# Patient Record
Sex: Female | Born: 1960 | Race: Black or African American | Hispanic: No | Marital: Married | Smoking: Never smoker
Health system: Southern US, Community
[De-identification: ages and names within clinical notes are randomized; demographics above are authoritative.]

## PROBLEM LIST (undated history)

## (undated) HISTORY — PX: CARPAL TUNNEL RELEASE: SHX101

## (undated) HISTORY — PX: ABDOMINAL HYSTERECTOMY: SHX81

---

## 2004-10-02 ENCOUNTER — Ambulatory Visit (HOSPITAL_COMMUNITY): Admission: RE | Admit: 2004-10-02 | Discharge: 2004-10-02 | Payer: Self-pay | Admitting: Orthopedic Surgery

## 2010-01-23 ENCOUNTER — Encounter: Admission: RE | Admit: 2010-01-23 | Discharge: 2010-01-23 | Payer: Self-pay | Admitting: Unknown Physician Specialty

## 2010-05-13 ENCOUNTER — Encounter: Payer: Self-pay | Admitting: Orthopedic Surgery

## 2012-09-18 ENCOUNTER — Other Ambulatory Visit: Payer: Self-pay

## 2012-09-18 DIAGNOSIS — Z1231 Encounter for screening mammogram for malignant neoplasm of breast: Secondary | ICD-10-CM

## 2012-10-16 ENCOUNTER — Ambulatory Visit: Payer: Self-pay

## 2012-11-04 ENCOUNTER — Ambulatory Visit: Payer: Self-pay

## 2014-02-02 ENCOUNTER — Other Ambulatory Visit: Payer: Self-pay

## 2014-02-02 DIAGNOSIS — Z1231 Encounter for screening mammogram for malignant neoplasm of breast: Secondary | ICD-10-CM

## 2014-02-15 ENCOUNTER — Ambulatory Visit
Admission: RE | Admit: 2014-02-15 | Discharge: 2014-02-15 | Disposition: A | Discharge status: Home / Self Care | Payer: BC Managed Care – PPO | Source: Ambulatory Visit

## 2014-02-15 DIAGNOSIS — Z1231 Encounter for screening mammogram for malignant neoplasm of breast: Secondary | ICD-10-CM

## 2015-02-17 ENCOUNTER — Other Ambulatory Visit: Payer: Self-pay

## 2015-02-17 DIAGNOSIS — Z1231 Encounter for screening mammogram for malignant neoplasm of breast: Secondary | ICD-10-CM

## 2015-03-06 ENCOUNTER — Ambulatory Visit: Payer: Self-pay

## 2017-10-02 ENCOUNTER — Emergency Department (HOSPITAL_BASED_OUTPATIENT_CLINIC_OR_DEPARTMENT_OTHER)
Admission: EM | Admit: 2017-10-02 | Discharge: 2017-10-02 | Disposition: A | Discharge status: Home / Self Care | Payer: Self-pay | Attending: Emergency Medicine | Admitting: Emergency Medicine

## 2017-10-02 ENCOUNTER — Emergency Department (HOSPITAL_BASED_OUTPATIENT_CLINIC_OR_DEPARTMENT_OTHER): Payer: Self-pay

## 2017-10-02 ENCOUNTER — Encounter (HOSPITAL_BASED_OUTPATIENT_CLINIC_OR_DEPARTMENT_OTHER): Payer: Self-pay | Admitting: *Deleted

## 2017-10-02 ENCOUNTER — Other Ambulatory Visit: Payer: Self-pay

## 2017-10-02 DIAGNOSIS — M779 Enthesopathy, unspecified: Secondary | ICD-10-CM

## 2017-10-02 DIAGNOSIS — M778 Other enthesopathies, not elsewhere classified: Secondary | ICD-10-CM

## 2017-10-02 DIAGNOSIS — M70842 Other soft tissue disorders related to use, overuse and pressure, left hand: Secondary | ICD-10-CM | POA: Insufficient documentation

## 2017-10-02 DIAGNOSIS — Y939 Activity, unspecified: Secondary | ICD-10-CM | POA: Insufficient documentation

## 2017-10-02 MED ORDER — IBUPROFEN 600 MG PO TABS: 600.0000 mg | ORAL_TABLET | Freq: Four times a day (QID) | ORAL | 0 refills | Status: DC | PRN

## 2017-10-02 MED ORDER — PREDNISONE 10 MG PO TABS: 20.0000 mg | ORAL_TABLET | Freq: Every day | ORAL | 0 refills | Status: AC

## 2017-10-02 NOTE — ED Triage Notes (Signed)
Left wrist pain x 3.5 weeks. No injury. Hx of carpal tunnel 20 years ago.

## 2017-10-02 NOTE — Discharge Instructions (Signed)
Thank you for allowing me to provide your care today in the emergency department.  Your x-ray today was negative.  To treat your symptoms at home:  Wear the Ace wrap as needed to provide compression to areas that are painful on your right wrist.  Apply ice for 15 to 20 minutes up to 3-4 times a day.  Make sure to have a cloth or an ice pack so that the ice is not directly on your skin, which can cause thermal burns.  Take 600 mg of ibuprofen with food or 600 mg of Tylenol every 6 hours as needed for pain control.  Prednisone is a corticosteroid medication to help with inflammation.  Take 2 tablets of this medication daily for the next 5 days.  This medication is safe to take with both ibuprofen and Tylenol.  Try to rest the right wrist over the next few days per your pain.  If your symptoms do not improve with this regimen over the next week, call 1 of the clinics above to get set up with primary care.  Return to the emergency department if you develop fever, chills, or if the joint becomes red, hot, or swollen to the touch.

## 2017-10-02 NOTE — ED Provider Notes (Signed)
[facility] EMERGENCY DEPARTMENT Provider Note   CSN: [id number] Arrival date & time: 10/02/17  1702     History   Chief Complaint Chief Complaint  Patient presents with  . Wrist Pain    HPI Theresa Meadows is a 57 y.o. female with no pertinent past medical history who presents to the emergency department with a chief complaint of atraumatic left wrist pain that began 3 weeks ago.  Pain is worse with ulnar deviation, flexion, and extension of the wrist and movement of the thumb.  She denies left elbow or shoulder pain.  She characterizes the pain as sharp.   No recent falls or injuries.  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  Denies any specific repetitive tasks.  She has intermittently treated her symptoms with 400 mg of Advil since onset with intermittent relief.   Wrist Pain  Pertinent negatives include no chest pain, no abdominal pain, no headaches and no shortness of breath.    History reviewed. No pertinent past medical history.  There are no active problems to display for this patient.   Past Surgical History:  Procedure Laterality Date  . ABDOMINAL HYSTERECTOMY    . CARPAL TUNNEL RELEASE    . CESAREAN SECTION       OB History   None      Home Medications    Prior to Admission medications   Medication Sig Start Date End Date Taking? Authorizing Provider  ibuprofen (ADVIL,MOTRIN) 600 MG tablet Take 1 tablet (600 mg total) by mouth every 6 (six) hours as needed. 10/02/17   Lynne Righi A, PA-C  predniSONE (DELTASONE) 10 MG tablet Take 2 tablets (20 mg total) by mouth daily for 5 days. 10/02/17 10/07/17  Cozy Veale, Pedro Earls A, PA-C    Family History No family history on file.  Social History Social History   Tobacco Use  . Smoking status: Never Smoker  . Smokeless tobacco: Never Used  Substance Use Topics  . Alcohol use: Yes  . Drug use: Never     Allergies   Patient has no known allergies.   Review of  Systems Review of Systems  Constitutional: Negative for activity change, chills and fever.  Respiratory: Negative for shortness of breath.   Cardiovascular: Negative for chest pain.  Gastrointestinal: Negative for abdominal pain.  Genitourinary: Negative for dysuria.  Musculoskeletal: Positive for arthralgias and myalgias. Negative for back pain, joint swelling, neck pain and neck stiffness.  Skin: Negative for rash.  Allergic/Immunologic: Negative for immunocompromised state.  Neurological: Negative for weakness, numbness and headaches.  Psychiatric/Behavioral: Negative for confusion.     Physical Exam Updated Vital Signs BP (!) 172/79 (BP Location: Right Arm)   Pulse 72   Temp 99 F (37.2 C) (Oral)   Resp 16   Ht 5' (1.524 m)   Wt 67.6 kg (149 lb)   SpO2 100%   BMI 29.10 kg/m   Physical Exam  Constitutional: No distress.  HENT:  Head: Normocephalic.  Eyes: Conjunctivae are normal.  Neck: Neck supple.  Cardiovascular: Normal rate and regular rhythm. Exam reveals no gallop and no friction rub.  No murmur heard. Pulmonary/Chest: Effort normal. No respiratory distress.  Abdominal: Soft. She exhibits no distension.  Musculoskeletal: She exhibits tenderness. She exhibits no edema or deformity.  Full active and passive range of motion of the left wrist, elbow, shoulder.  Radial pulses are 2+ and symmetric.  Sensation is intact and equal throughout.  Full active and  passive range of motion of all digits of the left hand.  Tender palpation along the radial aspect of the distal left wrist.  No overlying erythema, edema, or warmth.  Pain is worse with ulnar deviation, movement of the left thumb and flexion of the left wrist.  No obvious deformities, crepitus, or step-offs.  Neurological: She is alert.  Skin: Skin is warm. No rash noted.  Psychiatric: Her behavior is normal.  Nursing note and vitals reviewed.    ED Treatments / Results  Labs (all labs ordered are listed, but  only abnormal results are displayed) Labs Reviewed - No data to display  EKG None  Radiology Dg Wrist Complete Left  Result Date: 10/02/2017 CLINICAL DATA:  Left wrist pain for 3.5 weeks. EXAM: LEFT WRIST - COMPLETE 3+ VIEW COMPARISON:  None. FINDINGS: There is no evidence of fracture or dislocation. There is no evidence of arthropathy or other focal bone abnormality. Soft tissues are unremarkable. IMPRESSION: Negative. Electronically Signed   By: Elige KoHetal  Patel   On: 10/02/2017 17:41    Procedures Procedures (including critical care time)  Medications Ordered in ED Medications - No data to display   Initial Impression / Assessment and Plan / ED Course  I have reviewed the triage vital signs and the nursing notes.  Pertinent labs & imaging results that were available during my care of the patient were reviewed by me and considered in my medical decision making (see chart for details).     Patient X-Ray negative for obvious fracture or dislocation. Pain managed in ED. Pt advised to follow up with orthopedics if symptoms persist for possibility of missed fracture diagnosis. Patient given ace wrap while in ED, conservative therapy recommended and discussed. Patient will be dc home & is agreeable with above plan.  Final Clinical Impressions(s) / ED Diagnoses   Final diagnoses:  Tendinitis of finger of left hand    ED Discharge Orders        Ordered    predniSONE (DELTASONE) 10 MG tablet  Daily     10/02/17 1821    ibuprofen (ADVIL,MOTRIN) 600 MG tablet  Every 6 hours PRN     10/02/17 1821       Jermell Holeman A, PA-C 10/02/17 Margaree [id number]    Zackowski, Scott, MD 10/03/17 1511

## 2017-11-18 ENCOUNTER — Encounter: Payer: Self-pay | Admitting: Family Medicine

## 2017-11-18 ENCOUNTER — Ambulatory Visit (INDEPENDENT_AMBULATORY_CARE_PROVIDER_SITE_OTHER): Payer: Self-pay | Admitting: Family Medicine

## 2017-11-18 VITALS — BP 149/88 | HR 69 | Ht 60.0 in | Wt 149.2 lb

## 2017-11-18 DIAGNOSIS — M654 Radial styloid tenosynovitis [de Quervain]: Secondary | ICD-10-CM

## 2017-11-18 MED ORDER — DICLOFENAC SODIUM 75 MG PO TBEC: 75.0000 mg | DELAYED_RELEASE_TABLET | Freq: Two times a day (BID) | ORAL | 1 refills | Status: DC

## 2017-11-18 NOTE — Progress Notes (Signed)
PCP: Patient, No Pcp Per  Subjective:   HPI: Patient is a 57 y.o. female here for left wrist pain.  Patient reports for about 3-4 months she's had pain on radial side of left wrist. No acute injury or trauma. Started when she woke up with pain here. History of carpal tunnel surgery but this feels different. Tried wrapping this, icing, ibuprofen, some stretches with only mild benefit. Pain 9/10 and sharp, severe. No skin changes, numbness. Right handed.  History reviewed. No pertinent past medical history.  No current outpatient medications on file prior to visit.   No current facility-administered medications on file prior to visit.     Past Surgical History:  Procedure Laterality Date  . ABDOMINAL HYSTERECTOMY    . CARPAL TUNNEL RELEASE    . CESAREAN SECTION      No Known Allergies  Social History   Socioeconomic History  . Marital status: Married    Spouse name: Not on file  . Number of children: Not on file  . Years of education: Not on file  . Highest education level: Not on file  Occupational History  . Not on file  Social Needs  . Financial resource strain: Not on file  . Food insecurity:    Worry: Not on file    Inability: Not on file  . Transportation needs:    Medical: Not on file    Non-medical: Not on file  Tobacco Use  . Smoking status: Never Smoker  . Smokeless tobacco: Never Used  Substance and Sexual Activity  . Alcohol use: Yes  . Drug use: Never  . Sexual activity: Not on file  Lifestyle  . Physical activity:    Days per week: Not on file    Minutes per session: Not on file  . Stress: Not on file  Relationships  . Social connections:    Talks on phone: Not on file    Gets together: Not on file    Attends religious service: Not on file    Active member of club or organization: Not on file    Attends meetings of clubs or organizations: Not on file    Relationship status: Not on file  . Intimate partner violence:    Fear of current  or ex partner: Not on file    Emotionally abused: Not on file    Physically abused: Not on file    Forced sexual activity: Not on file  Other Topics Concern  . Not on file  Social History Narrative  . Not on file    History reviewed. No pertinent family history.  BP (!) 149/88   Pulse 69   Ht 5' (1.524 m)   Wt 149 lb 3.2 oz (67.7 kg)   BMI 29.14 kg/m   Review of Systems: See HPI above.     Objective:  Physical Exam:  Gen: NAD, comfortable in exam room  Left wrist: No deformity, swelling, bruising. FROM with 5/5 strength abduction, flexion, thumb opposition. TTP 1st dorsal compartment.  No TTP 1st CMC, carpal tunnel. Negative tinels, phalens. Positive finkelsteins. NVI distally.  Right wrist: No deformity. FROM with 5/5 strength. No tenderness to palpation. NVI distally.   Assessment & Plan:  1. Left de Quervain's tenosynovitis - discussed options.  She will start with thumb spica brace, icing, diclofenac twice a day.  Consider injection if not improving.  F/u in 1 month.

## 2017-11-18 NOTE — Patient Instructions (Signed)
You have deQuervain's tenosynovitis of your thumb/wrist. Avoid painful activities as much as possible. Wear the thumb spica brace as often as possible to rest this. Ice 15 minutes at a time 3-4 times a day. Stop ibuprofen. Take diclofenac 75mg  twice a day with food for pain and inflammation. If not improving would consider a cortisone shot. Follow up with me in 1 month for reevaluation.

## 2017-12-09 ENCOUNTER — Encounter: Payer: Self-pay | Admitting: Family Medicine

## 2017-12-09 ENCOUNTER — Ambulatory Visit (INDEPENDENT_AMBULATORY_CARE_PROVIDER_SITE_OTHER): Payer: Self-pay | Admitting: Family Medicine

## 2017-12-09 VITALS — BP 141/78 | HR 67 | Ht 60.0 in | Wt 149.0 lb

## 2017-12-09 DIAGNOSIS — M654 Radial styloid tenosynovitis [de Quervain]: Secondary | ICD-10-CM

## 2017-12-09 MED ORDER — METHYLPREDNISOLONE ACETATE 40 MG/ML IJ SUSP
20.0000 mg | Freq: Once | INTRAMUSCULAR | Status: AC
  Administered 2017-12-09: 20 mg via INTRA_ARTICULAR

## 2017-12-09 NOTE — Progress Notes (Signed)
PCP: Patient, No Pcp Per  Subjective:   HPI: Patient is a 57 y.o. female here for left wrist pain.  7/30: Patient reports for about 3-4 months she's had pain on radial side of left wrist. No acute injury or trauma. Started when she woke up with pain here. History of carpal tunnel surgery but this feels different. Tried wrapping this, icing, ibuprofen, some stretches with only mild benefit. Pain 9/10 and sharp, severe. No skin changes, numbness. Right handed.  8/20: Patient returns reporting she feels about the same compared to last visit. She is using a thumb spica and taking diclofenac twice a day. She is also icing this. Pain is on the radial aspect of the wrist into the thumb. When really bad this can radiate up the arm. No numbness but she does get some tingling sensations in the area. Pain is 9 out of 10 and sharp. No skin changes.  History reviewed. No pertinent past medical history.  Current Outpatient Medications on File Prior to Visit  Medication Sig Dispense Refill  . diclofenac (VOLTAREN) 75 MG EC tablet Take 1 tablet (75 mg total) by mouth 2 (two) times daily. 60 tablet 1   No current facility-administered medications on file prior to visit.     Past Surgical History:  Procedure Laterality Date  . ABDOMINAL HYSTERECTOMY    . CARPAL TUNNEL RELEASE    . CESAREAN SECTION      No Known Allergies  Social History   Socioeconomic History  . Marital status: Married    Spouse name: Not on file  . Number of children: Not on file  . Years of education: Not on file  . Highest education level: Not on file  Occupational History  . Not on file  Social Needs  . Financial resource strain: Not on file  . Food insecurity:    Worry: Not on file    Inability: Not on file  . Transportation needs:    Medical: Not on file    Non-medical: Not on file  Tobacco Use  . Smoking status: Never Smoker  . Smokeless tobacco: Never Used  Substance and Sexual Activity  .  Alcohol use: Yes  . Drug use: Never  . Sexual activity: Not on file  Lifestyle  . Physical activity:    Days per week: Not on file    Minutes per session: Not on file  . Stress: Not on file  Relationships  . Social connections:    Talks on phone: Not on file    Gets together: Not on file    Attends religious service: Not on file    Active member of club or organization: Not on file    Attends meetings of clubs or organizations: Not on file    Relationship status: Not on file  . Intimate partner violence:    Fear of current or ex partner: Not on file    Emotionally abused: Not on file    Physically abused: Not on file    Forced sexual activity: Not on file  Other Topics Concern  . Not on file  Social History Narrative  . Not on file    History reviewed. No pertinent family history.  BP (!) 141/78   Pulse 67   Ht 5' (1.524 m)   Wt 149 lb (67.6 kg)   BMI 29.10 kg/m   Review of Systems: See HPI above.     Objective:  Physical Exam:  Gen: NAD, comfortable in exam room  Left  wrist: No gross deformity, swelling, bruising. Full range of motion with 5 out of 5 strength with abduction, flexion, thumb opposition. There is to palpation first dorsal compartment.  No tenderness to palpation first CMC or the carpal tunnel. Negative Tinel's.  Positive Finkelstein's. Neurovascularly intact distally.   Assessment & Plan:  1. Left de Quervain's tenosynovitis -unfortunately she is not improving with a thumb spica brace, icing, diclofenac.  Went ahead with first dorsal compartment injection today.  She was instructed to continue wearing the thumb spica brace, ice this, take diclofenac now as needed.  She will follow-up in 1 month.  After informed written consent timeout was performed the patient was seated in chair in exam room.  Area over left first dorsal compartment of the wrist prepped with alcohol swab then injected with 0.5 to 0.5 mL Depo-Medrol to bupivacaine.  Patient  tolerated the procedure well without immediate complications.

## 2017-12-09 NOTE — Patient Instructions (Signed)
You have deQuervain's tenosynovitis of your thumb/wrist. Avoid painful activities as much as possible. Wear the thumb spica brace as often as possible to rest this. Ice 15 minutes at a time 3-4 times a day. Take diclofenac 75mg  twice a day with food for pain and inflammation as needed. You were given a cortisone injection today. Follow up with me in 1 month for reevaluation.

## 2017-12-19 ENCOUNTER — Ambulatory Visit: Payer: Self-pay | Admitting: Family Medicine

## 2018-01-06 ENCOUNTER — Ambulatory Visit: Payer: Self-pay | Admitting: Family Medicine

## 2018-02-26 ENCOUNTER — Other Ambulatory Visit: Payer: Self-pay

## 2018-02-26 ENCOUNTER — Emergency Department (HOSPITAL_BASED_OUTPATIENT_CLINIC_OR_DEPARTMENT_OTHER)
Admission: EM | Admit: 2018-02-26 | Discharge: 2018-02-26 | Disposition: A | Discharge status: Home / Self Care | Payer: Self-pay | Attending: Emergency Medicine | Admitting: Emergency Medicine

## 2018-02-26 ENCOUNTER — Encounter (HOSPITAL_BASED_OUTPATIENT_CLINIC_OR_DEPARTMENT_OTHER): Payer: Self-pay

## 2018-02-26 DIAGNOSIS — B86 Scabies: Secondary | ICD-10-CM | POA: Insufficient documentation

## 2018-02-26 DIAGNOSIS — Z79899 Other long term (current) drug therapy: Secondary | ICD-10-CM | POA: Insufficient documentation

## 2018-02-26 DIAGNOSIS — R21 Rash and other nonspecific skin eruption: Secondary | ICD-10-CM

## 2018-02-26 MED ORDER — HYDROXYZINE HCL 25 MG PO TABS: 25.0000 mg | ORAL_TABLET | Freq: Four times a day (QID) | ORAL | 0 refills | Status: DC | PRN

## 2018-02-26 MED ORDER — PERMETHRIN 5 % EX CREA: TOPICAL_CREAM | CUTANEOUS | 0 refills | Status: DC

## 2018-02-26 NOTE — ED Notes (Signed)
C/o rash  (scattered bumps) on left neck upper thigh, back under breast, and vaginal area  X 1-2 weeks  denies pain but c/o itching

## 2018-02-26 NOTE — ED Triage Notes (Addendum)
Pt c/o scattered rash x 1 week-NAD-steady gait-pos scabies exposure

## 2018-02-27 NOTE — ED Provider Notes (Signed)
[facility] EMERGENCY DEPARTMENT Provider Note   CSN: [id number] Arrival date & time: 02/26/18  1843     History   Chief Complaint Chief Complaint  Patient presents with  . Rash    HPI Theresa Meadows is a 57 y.o. female.  HPI  57yo female presents with concern for rash.  Reports symptoms present for one week, notes small bumps that are extremely pruritic present on hands, under breast, groin, under buttock, neck and back.  Reports had family visit who works in mental institution and was diagnosed with scabies approximately 6wk ago.  Reports her husband was seen and diagnosed with possible scabies and she also used his permethrin. Reports she has treated self several times now and also washed everything per protocol, however over the last week developed symptoms again and is concerned it is back.  No other new exposures, no change in detergent or soap. Family members do not currentlyhave rash. Severe itching, improves with benadryl then improves.   History reviewed. No pertinent past medical history.  There are no active problems to display for this patient.   Past Surgical History:  Procedure Laterality Date  . ABDOMINAL HYSTERECTOMY    . CARPAL TUNNEL RELEASE    . CESAREAN SECTION       OB History   None      Home Medications    Prior to Admission medications   Medication Sig Start Date End Date Taking? Authorizing Provider  diclofenac (VOLTAREN) 75 MG EC tablet Take 1 tablet (75 mg total) by mouth 2 (two) times daily. 11/18/17   Lenda Kelp, MD  hydrOXYzine (ATARAX/VISTARIL) 25 MG tablet Take 1 tablet (25 mg total) by mouth every 6 (six) hours as needed for itching. 02/26/18   Alvira Monday, MD  permethrin (ELIMITE) 5 % cream Apply to affected area once from neck down, leave on for 8-10 hours.  May reapply in 2 weeks. 02/26/18   Alvira Monday, MD    Family History No family history on file.  Social History Social History   Tobacco Use  .  Smoking status: Never Smoker  . Smokeless tobacco: Never Used  Substance Use Topics  . Alcohol use: Yes    Comment: occ  . Drug use: Never     Allergies   Patient has no known allergies.   Review of Systems Review of Systems  Constitutional: Negative for fever.  HENT: Negative for sore throat.   Respiratory: Negative for cough.   Gastrointestinal: Negative for abdominal pain.  Skin: Positive for rash.  Neurological: Negative for syncope.     Physical Exam Updated Vital Signs BP 128/74 (BP Location: Left Arm)   Pulse 74   Temp 98.3 F (36.8 C) (Oral)   Resp 18   Ht 5\' 1"  (1.549 m)   Wt 65.8 kg   SpO2 100%   BMI 27.40 kg/m   Physical Exam  Constitutional: She is oriented to person, place, and time. She appears well-developed and well-nourished. No distress.  HENT:  Head: Normocephalic and atraumatic.  Eyes: Conjunctivae and EOM are normal.  Neck: Normal range of motion.  Cardiovascular: Normal rate, regular rhythm and intact distal pulses.  Pulmonary/Chest: Effort normal. No respiratory distress.  Musculoskeletal: She exhibits no edema or tenderness.  Neurological: She is alert and oriented to person, place, and time.  Skin: Skin is warm and dry. Rash (tiny papule over distal wrist/proximal hand, over back, groin) noted. She is not diaphoretic. No erythema.  Nursing note and vitals  reviewed.    ED Treatments / Results  Labs (all labs ordered are listed, but only abnormal results are displayed) Labs Reviewed - No data to display  EKG None  Radiology No results found.  Procedures Procedures (including critical care time)  Medications Ordered in ED Medications - No data to display   Initial Impression / Assessment and Plan / ED Course  I have reviewed the triage vital signs and the nursing notes.  Pertinent labs & imaging results that were available during my care of the patient were reviewed by me and considered in my medical decision making (see  chart for details).     57 year old female presents with concern for rash and pruritus.  She has recently been treated for scabies, with concern for possible exposure 6 weeks ago, however no return patient with some lesions that may be consistent with scabies, including tiny papule right hand, and locations present (although not in web spaces) are consistent, however rash is not classic for scabies, no burrows noted, not grouped. Some of groin rash consistent with folliculitis. No abscess or cellulitis.  Possible allergic etiology, however given concern for possible scabies will recommend treatment and follow up with PCP/dermatology.   Final Clinical Impressions(s) / ED Diagnoses   Final diagnoses:  Rash  Scabies    ED Discharge Orders         Ordered    hydrOXYzine (ATARAX/VISTARIL) 25 MG tablet  Every 6 hours PRN,   Status:  Discontinued     02/26/18 2003    permethrin (ELIMITE) 5 % cream  Status:  Discontinued     02/26/18 2003    permethrin (ELIMITE) 5 % cream     02/26/18 2004    hydrOXYzine (ATARAX/VISTARIL) 25 MG tablet  Every 6 hours PRN     02/26/18 2010           Alvira Monday, MD 02/27/18 1242

## 2018-04-28 ENCOUNTER — Ambulatory Visit
Admission: RE | Admit: 2018-04-28 | Discharge: 2018-04-28 | Disposition: A | Discharge status: Home / Self Care | Payer: No Typology Code available for payment source | Source: Ambulatory Visit | Attending: Family Medicine | Admitting: Family Medicine

## 2018-04-28 ENCOUNTER — Ambulatory Visit (INDEPENDENT_AMBULATORY_CARE_PROVIDER_SITE_OTHER): Payer: Self-pay | Admitting: Family Medicine

## 2018-04-28 VITALS — BP 122/84 | Ht 60.0 in | Wt 148.0 lb

## 2018-04-28 DIAGNOSIS — S6992XA Unspecified injury of left wrist, hand and finger(s), initial encounter: Secondary | ICD-10-CM

## 2018-04-28 DIAGNOSIS — M654 Radial styloid tenosynovitis [de Quervain]: Secondary | ICD-10-CM

## 2018-04-28 MED ORDER — METHYLPREDNISOLONE ACETATE 40 MG/ML IJ SUSP
20.0000 mg | Freq: Once | INTRAMUSCULAR | Status: AC
  Administered 2018-04-28: 20 mg via INTRA_ARTICULAR

## 2018-04-28 NOTE — Patient Instructions (Addendum)
You have deQuervain's tenosynovitis of your thumb/wrist. Avoid painful activities as much as possible. Wear the thumb spica brace as often as possible to rest this. Ice 15 minutes at a time 3-4 times a day. A cortisone injection typically helps a great deal with this and is an option - you were given this today Follow up with me in 1 month for reevaluation.

## 2018-04-29 ENCOUNTER — Encounter: Payer: Self-pay | Admitting: Family Medicine

## 2018-04-29 NOTE — Progress Notes (Signed)
PCP: Patient, No Pcp Per  Subjective:   HPI: Patient is a 58 y.o. female here for left wrist pain.  7/30: Patient reports for about 3-4 months she's had pain on radial side of left wrist. No acute injury or trauma. Started when she woke up with pain here. History of carpal tunnel surgery but this feels different. Tried wrapping this, icing, ibuprofen, some stretches with only mild benefit. Pain 9/10 and sharp, severe. No skin changes, numbness. Right handed.  8/20: Patient returns reporting she feels about the same compared to last visit. She is using a thumb spica and taking diclofenac twice a day. She is also icing this. Pain is on the radial aspect of the wrist into the thumb. When really bad this can radiate up the arm. No numbness but she does get some tingling sensations in the area. Pain is 9 out of 10 and sharp. No skin changes.  04/28/18: Patient reports she did well after last visit following injection and pain had resolved. And she reports in October she actually fell forward flat onto her bilateral hands but more so on the left side. Ever since then she has had pain on the radial aspect of the left wrist. She is returned to wearing a wrap on her thumb with minimal benefit. No skin changes or numbness. Pain is severe and sharp.  History reviewed. No pertinent past medical history.  Current Outpatient Medications on File Prior to Visit  Medication Sig Dispense Refill  . diclofenac (VOLTAREN) 75 MG EC tablet Take 1 tablet (75 mg total) by mouth 2 (two) times daily. 60 tablet 1  . hydrOXYzine (ATARAX/VISTARIL) 25 MG tablet Take 1 tablet (25 mg total) by mouth every 6 (six) hours as needed for itching. (Patient not taking: Reported on 04/28/2018) 15 tablet 0   No current facility-administered medications on file prior to visit.     Past Surgical History:  Procedure Laterality Date  . ABDOMINAL HYSTERECTOMY    . CARPAL TUNNEL RELEASE    . CESAREAN SECTION      No  Known Allergies  Social History   Socioeconomic History  . Marital status: Married    Spouse name: Not on file  . Number of children: Not on file  . Years of education: Not on file  . Highest education level: Not on file  Occupational History  . Not on file  Social Needs  . Financial resource strain: Not on file  . Food insecurity:    Worry: Not on file    Inability: Not on file  . Transportation needs:    Medical: Not on file    Non-medical: Not on file  Tobacco Use  . Smoking status: Never Smoker  . Smokeless tobacco: Never Used  Substance and Sexual Activity  . Alcohol use: Yes    Comment: occ  . Drug use: Never  . Sexual activity: Not on file  Lifestyle  . Physical activity:    Days per week: Not on file    Minutes per session: Not on file  . Stress: Not on file  Relationships  . Social connections:    Talks on phone: Not on file    Gets together: Not on file    Attends religious service: Not on file    Active member of club or organization: Not on file    Attends meetings of clubs or organizations: Not on file    Relationship status: Not on file  . Intimate partner violence:  Fear of current or ex partner: Not on file    Emotionally abused: Not on file    Physically abused: Not on file    Forced sexual activity: Not on file  Other Topics Concern  . Not on file  Social History Narrative  . Not on file    History reviewed. No pertinent family history.  BP 122/84   Ht 5' (1.524 m)   Wt 148 lb (67.1 kg)   BMI 28.90 kg/m   Review of Systems: See HPI above.     Objective:  Physical Exam:  Gen: NAD, comfortable in exam room  Left wrist: Small bony prominence distal radius.  No bruising, swelling, other deformity. FROM with 5/5 strength digits.  Pain on ulnar deviation, thumb motions. TTP over distal radius, 1st dorsal compartment.  No 1st CMC, carpal tunnel, other tenderness. Negative tinel's, positive finkelstein's. NVI distally.    Assessment & Plan:  1. Left de Quervain's tenosynovitis -radiographs were performed given her acute injury and noted no evidence of acute fracture on independent review of these.  Consistent with traumatic version of de Quervain's tenosynovitis.  We discussed options and went ahead with repeat injection into the first dorsal compartment.  She wear a thumb spica brace, ice this.  Follow-up in 1 month for reevaluation.  After informed written consent timeout was performed the patient was seated on exam table.  Area overlying left first dorsal compartment of the wrist was prepped with alcohol swab then injected with 0.5 to 0.5 mL's of Depo-Medrol to Marcaine.  Patient tolerated the procedure well without immediate complications.

## 2018-06-03 ENCOUNTER — Encounter (HOSPITAL_BASED_OUTPATIENT_CLINIC_OR_DEPARTMENT_OTHER): Payer: Self-pay | Admitting: Emergency Medicine

## 2018-06-03 ENCOUNTER — Emergency Department (HOSPITAL_BASED_OUTPATIENT_CLINIC_OR_DEPARTMENT_OTHER)
Admission: EM | Admit: 2018-06-03 | Discharge: 2018-06-03 | Disposition: A | Discharge status: Home / Self Care | Payer: Self-pay | Attending: Emergency Medicine | Admitting: Emergency Medicine

## 2018-06-03 ENCOUNTER — Emergency Department (HOSPITAL_BASED_OUTPATIENT_CLINIC_OR_DEPARTMENT_OTHER): Payer: Self-pay

## 2018-06-03 ENCOUNTER — Other Ambulatory Visit: Payer: Self-pay

## 2018-06-03 DIAGNOSIS — R070 Pain in throat: Secondary | ICD-10-CM | POA: Insufficient documentation

## 2018-06-03 DIAGNOSIS — Z79899 Other long term (current) drug therapy: Secondary | ICD-10-CM | POA: Insufficient documentation

## 2018-06-03 DIAGNOSIS — R05 Cough: Secondary | ICD-10-CM | POA: Insufficient documentation

## 2018-06-03 DIAGNOSIS — R059 Cough, unspecified: Secondary | ICD-10-CM

## 2018-06-03 MED ORDER — BENZONATATE 100 MG PO CAPS: 100.0000 mg | ORAL_CAPSULE | Freq: Three times a day (TID) | ORAL | 0 refills | Status: DC

## 2018-06-03 MED ORDER — IBUPROFEN 800 MG PO TABS
800.0000 mg | ORAL_TABLET | Freq: Once | ORAL | Status: AC
  Administered 2018-06-03: 800 mg via ORAL
  Filled 2018-06-03: qty 1

## 2018-06-03 NOTE — ED Notes (Signed)
ED Provider at bedside. 

## 2018-06-03 NOTE — Discharge Instructions (Addendum)
You are seen in the emergency department today for a cough.  We will call you if your chest x-ray shows pneumonia.  Otherwise we suspect this is related to a virus.  Please take Tessalon as needed for cough every 8 hours.  Please follow-up with primary care within 3 to 5 days.  Return to the ER for new or worsening symptoms or any other concerns.  We have prescribed you new medication(s) today. Discuss the medications prescribed today with your pharmacist as they can have adverse effects and interactions with your other medicines including over the counter and prescribed medications. Seek medical evaluation if you start to experience new or abnormal symptoms after taking one of these medicines, seek care immediately if you start to experience difficulty breathing, feeling of your throat closing, facial swelling, or rash as these could be indications of a more serious allergic reaction

## 2018-06-03 NOTE — ED Triage Notes (Signed)
Reports cough, fever, chills, and sore throat since Monday.  Took 1000mg  tylenol at 1600 today.

## 2018-06-03 NOTE — ED Provider Notes (Signed)
[facility] EMERGENCY DEPARTMENT Provider Note   CSN: [id number] Arrival date & time: 06/03/18  1832     History   Chief Complaint Chief Complaint  Patient presents with  . Cough    HPI Theresa Meadows is a 58 y.o. female without significant past medical hx who presents to the ED with complaints of cough that began approximately 48 hours prior.  Patient states she has had congestion, cough, mild sore throat secondary to coughing, subjective fever, and chills.  States symptoms are constant.  No specific alleviating or aggravating factors.  She has tried Robitussin without relief.  Denies nausea, vomiting, chest pain, dyspnea, abdominal pain, or diarrhea.  Did not receive her flu shot this.  No specific sick contacts with similar symptoms.  HPI  History reviewed. No pertinent past medical history.  There are no active problems to display for this patient.   Past Surgical History:  Procedure Laterality Date  . ABDOMINAL HYSTERECTOMY    . CARPAL TUNNEL RELEASE    . CESAREAN SECTION       OB History   No obstetric history on file.      Home Medications    Prior to Admission medications   Medication Sig Start Date End Date Taking? Authorizing Provider  diclofenac (VOLTAREN) 75 MG EC tablet Take 1 tablet (75 mg total) by mouth 2 (two) times daily. 11/18/17   Lenda Kelp, MD  hydrOXYzine (ATARAX/VISTARIL) 25 MG tablet Take 1 tablet (25 mg total) by mouth every 6 (six) hours as needed for itching. Patient not taking: Reported on 04/28/2018 02/26/18   Alvira Monday, MD    Family History History reviewed. No pertinent family history.  Social History Social History   Tobacco Use  . Smoking status: Never Smoker  . Smokeless tobacco: Never Used  Substance Use Topics  . Alcohol use: Yes    Comment: occ  . Drug use: Never     Allergies   Patient has no known allergies.   Review of Systems Review of Systems  Constitutional: Positive for chills and  fever.  HENT: Positive for congestion and sore throat. Negative for ear pain.   Respiratory: Positive for cough. Negative for shortness of breath, wheezing and stridor.   Cardiovascular: Negative for chest pain and leg swelling.  Gastrointestinal: Negative for abdominal pain, diarrhea, nausea and vomiting.     Physical Exam Updated Vital Signs BP 139/82 (BP Location: Right Arm)   Pulse 96   Temp 100.3 F (37.9 C) (Oral)   Resp 20   Ht 5' (1.524 m)   Wt 67.1 kg   SpO2 100%   BMI 28.90 kg/m   Physical Exam Vitals signs and nursing note reviewed.  Constitutional:      General: She is not in acute distress.    Appearance: She is well-developed.  HENT:     Head: Normocephalic and atraumatic.     Right Ear: Tympanic membrane is not perforated, erythematous, retracted or bulging.     Left Ear: Tympanic membrane is not perforated, erythematous, retracted or bulging.     Nose: Congestion present.     Comments: No sinus tenderness.    Mouth/Throat:     Pharynx: Uvula midline. No oropharyngeal exudate or posterior oropharyngeal erythema.     Comments: Posterior oropharynx is symmetric appearing. Patient tolerating own secretions without difficulty. No trismus. No drooling. No hot potato voice. No swelling beneath the tongue, submandibular compartment is soft.  Eyes:     General:  Right eye: No discharge.        Left eye: No discharge.     Conjunctiva/sclera: Conjunctivae normal.     Pupils: Pupils are equal, round, and reactive to light.  Neck:     Musculoskeletal: Normal range of motion and neck supple. No neck rigidity.  Cardiovascular:     Rate and Rhythm: Normal rate and regular rhythm.     Heart sounds: No murmur.  Pulmonary:     Effort: Pulmonary effort is normal. No respiratory distress.     Breath sounds: Normal breath sounds. No stridor. No wheezing, rhonchi or rales.  Abdominal:     General: There is no distension.     Palpations: Abdomen is soft.      Tenderness: There is no abdominal tenderness.  Lymphadenopathy:     Cervical: No cervical adenopathy.  Skin:    General: Skin is warm and dry.     Findings: No rash.  Neurological:     Mental Status: She is alert.  Psychiatric:        Behavior: Behavior normal.      ED Treatments / Results  Labs (all labs ordered are listed, but only abnormal results are displayed) Labs Reviewed - No data to display  EKG None  Radiology Dg Chest 2 View  Result Date: 06/03/2018 CLINICAL DATA:  Cough and fever for several days EXAM: CHEST - 2 VIEW COMPARISON:  None. FINDINGS: The heart size and mediastinal contours are within normal limits. Both lungs are clear. The visualized skeletal structures are unremarkable. IMPRESSION: No active cardiopulmonary disease. Electronically Signed   By: Alcide Clever M.D.   On: 06/03/2018 20:39    Procedures Procedures (including critical care time)  Medications Ordered in ED Medications  ibuprofen (ADVIL,MOTRIN) tablet 800 mg (800 mg Oral Given 06/03/18 1840)     Initial Impression / Assessment and Plan / ED Course  I have reviewed the triage vital signs and the nursing notes.  Pertinent labs & imaging results that were available during my care of the patient were reviewed by me and considered in my medical decision making (see chart for details).    Patient presents with URI type symptoms.  Patient is nontoxic appearing, in no apparent distress, vitals initially notable for fever w/ likely resultant tachycardia which normalized w/ anti-pyretics. Lungs are CTA, CXR  negative for infiltrate, doubt pneumonia. There is no wheezing or signs of respiratory distress. Sxs onset < 7 days,  no sinus tenderness, doubt acute bacterial sinusitis. Centor score 1 for fever, doubt strep pharyngitis. No evidence of AOM on exam. No meningeal signs. Suspect viral, possibly influenza, no comorbidities to necessitate tamiflu- discussed risks/benefits of this medicine- patient  declined. Tessalon prescribed for symptomatic relief, motrin/tylenol recommended for fevers. I discussed treatment plan, need for PCP follow-up, and return precautions with the patient. Provided opportunity for questions, patient confirmed understanding and is in agreement with plan.    Final Clinical Impressions(s) / ED Diagnoses   Final diagnoses:  Cough    ED Discharge Orders         Ordered    benzonatate (TESSALON) 100 MG capsule  Every 8 hours     06/03/18 2040           Cherly Anderson, PA-C 06/03/18 2051    Maia Plan, MD 06/04/18 1037

## 2019-05-31 ENCOUNTER — Ambulatory Visit (INDEPENDENT_AMBULATORY_CARE_PROVIDER_SITE_OTHER): Payer: 59 | Admitting: Family Medicine

## 2019-05-31 ENCOUNTER — Encounter: Payer: Self-pay | Admitting: Family Medicine

## 2019-05-31 ENCOUNTER — Other Ambulatory Visit: Payer: Self-pay

## 2019-05-31 DIAGNOSIS — M65312 Trigger thumb, left thumb: Secondary | ICD-10-CM | POA: Diagnosis not present

## 2019-05-31 MED ORDER — DICLOFENAC SODIUM 75 MG PO TBEC: 75.0000 mg | DELAYED_RELEASE_TABLET | Freq: Two times a day (BID) | ORAL | 1 refills | Status: DC

## 2019-05-31 NOTE — Patient Instructions (Signed)
You have a trigger thumb. Wear brace or do band-aid trick as often as possible for next 6 weeks. Heat 15 minutes at a time as needed. Diclofenac 75mg  twice a day with food for the inflammation. Ok to take tylenol with this if needed. A steroid injection is a consideration for this. Follow up with me in 6 weeks.

## 2019-05-31 NOTE — Progress Notes (Signed)
   HPI  CC:  Patient is a 59 y.o. female here for left thumb pain.  05/31/19: Patient reports that for the past 4 weeks she has noticed her thumb popping.  She states it is worse in the morning and sometimes it is stiff when she wakes up.  He states that sometimes it can be after a long day and when she goes to bed at night it would also be stiff.  She states that it can sometimes radiate to her wrist and her pointer finger.  She denies any numbness or tingling.  She denies any trauma or injury to the finger.  She denies any swelling.  She states that she has been taking Advil and Tylenol which has periodically helped and she almost did not come in but it then flared up again.  She states it is worse when she is opening containers or wringing out towels.  See HPI and/or previous note for associated ROS.  Objective: BP 122/84   Ht 5' (1.524 m)   Wt 158 lb (71.7 kg)   BMI 30.86 kg/m  Gen: R-Hand Dominant. NAD, well groomed, a/o x3, normal affect.  CV: Well-perfused. Warm.  Resp: Non-labored.  Left thumb: No deformity, swelling or redness noted. FROM with 5/5 strength in Abd/Add/Ext/Flex of digits and wrist. TTP over A1 pulley of thumb.  No 1st CMC, 1st dorsal compartment, carpal tunnel tenderness.  NVI distally. Negative tinels, phalens, Finkelstein.  Assessment and plan:  Trigger thumb of left hand Patient shown three splint options that will limit movement of thumb IP joint. She is to use one of these options along with NSAID, Diclofenac 75mg  twice a day with food. Patient offered injection today, but would like to try splinting and NSAID first. To return in 6 weeks for follow up.    Leslyn Monda, DO PGY-3, [facility]  05/31/2019 9:17 AM

## 2019-05-31 NOTE — Assessment & Plan Note (Signed)
Patient shown three splint options that will limit movement of thumb IP joint. She is to use one of these options along with NSAID, Diclofenac 75mg  twice a day with food. Patient offered injection today, but would like to try splinting and NSAID first. To return in 6 weeks for follow up.

## 2019-07-12 ENCOUNTER — Encounter: Payer: Self-pay | Admitting: Family Medicine

## 2019-07-12 ENCOUNTER — Ambulatory Visit (INDEPENDENT_AMBULATORY_CARE_PROVIDER_SITE_OTHER): Payer: 59 | Admitting: Family Medicine

## 2019-07-12 ENCOUNTER — Other Ambulatory Visit: Payer: Self-pay

## 2019-07-12 VITALS — BP 110/80 | Ht 60.0 in | Wt 154.0 lb

## 2019-07-12 DIAGNOSIS — M65312 Trigger thumb, left thumb: Secondary | ICD-10-CM | POA: Diagnosis not present

## 2019-07-12 MED ORDER — METHYLPREDNISOLONE ACETATE 40 MG/ML IJ SUSP
20.0000 mg | Freq: Once | INTRAMUSCULAR | Status: AC
  Administered 2019-07-12: 20 mg via INTRA_ARTICULAR

## 2019-07-12 NOTE — Progress Notes (Signed)
    SUBJECTIVE:   CHIEF COMPLAINT / HPI:  Right-hand-dominant patient presenting with pain at the base of her left thumb.  Patient reports that she has pain with thumb flexion.  Pain is worse with grip or wringing out towels.  She reports that at the end of the day, she has a throbbing and aching pain.  She also reports an burning pain in the low lateral aspects of her thumb.  PERTINENT  PMH / PSH: Left De Quervain's status post injection (11/2017, 04/2018), history of carpal tunnel status post release surgery bilaterally  OBJECTIVE:   BP 110/80   Ht 5' (1.524 m)   Wt 154 lb (69.9 kg)   BMI 30.08 kg/m    General: Well-appearing female, no acute distress  Hands: There is no gross abnormality, atrophy, hypertrophy appreciated in bilateral hands.  Patient has tenderness to palpation at the base of her first MCP particularly in the palmar aspect at A1 pulley.  There is a palpable nodule that spans the palmar base of the joint.  There is no warmth or tissue swelling in the area on palpation.  Patient has full range of motion.  She has some pain with repeated flexion of her left thumb.  Her strength is 5 out of 5 in her upper extremities.  Negative Finkelstein's, negative Tinel's.  ASSESSMENT/PLAN:   Left trigger thumb injection Procedure: Informed consent obtained and timeout performed.  Injection site was identified and marked after palpation of first MCP nodule at the palmar aspect.  The area was prepped in the usual sterile manner. The needle was inserted into the affected area and injected with 0.5:0.31mL bupivicaine: depomedrol.  There were no complications during this procedure. Followup: The patient tolerated the procedure well without complications.  Standard post-procedure care is explained and return precautions are given.  1. Left Thumb Pain -left trigger thumb Patient presenting with continued pain that has failed conservative management of her left thumb.  Today, she is agreeable to  injection.  This would be the first injection at level of her A1 pulley.  Discussed possible management options.  Will consider another injection, but if that fails, would refer to surgery.  Patient can continue to wear brace and take anti-inflammatories, but should be feeling improvement from injection over the next week.  Return to care in 4 to 6 weeks or sooner if needed.  Melene Plan, MD [facility]

## 2019-07-12 NOTE — Patient Instructions (Signed)
You have a trigger thumb. Wear brace or do band-aid trick as often as possible for next 6 weeks. Heat 15 minutes at a time as needed. Diclofenac 75mg  twice a day with food for the inflammation as needed. Ok to take tylenol with this if needed. A steroid injection is a consideration for this - you were given this today. Consider hand therapy, repeat injection, surgery referral if not improving.

## 2019-08-01 IMAGING — CR DG CHEST 2V
2 series · 2 of 2 positions shown · non-contrast
Comparison: None.

CLINICAL DATA: Cough and fever for several days

EXAM:
CHEST - 2 VIEW

[w chest pa]
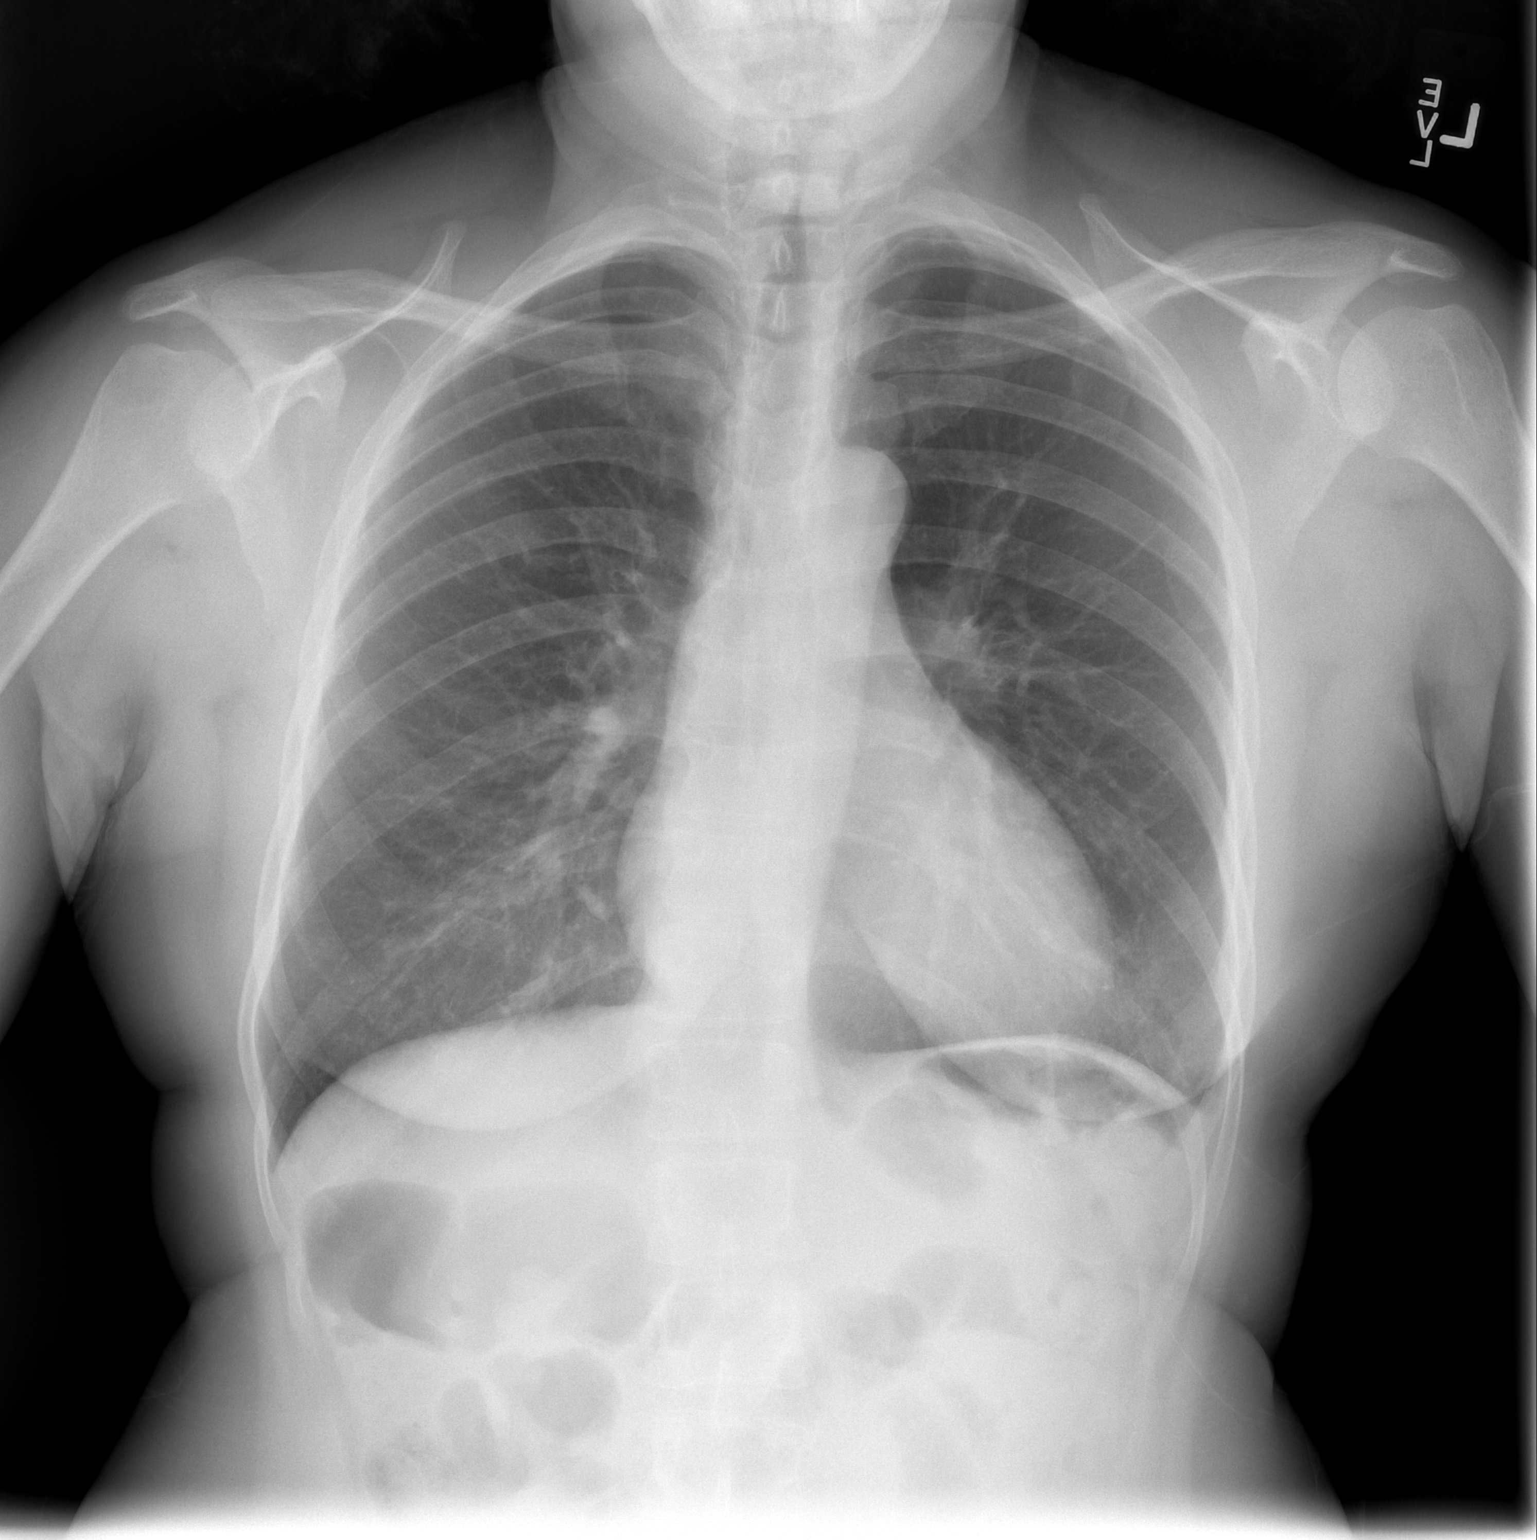

[w chest lat]
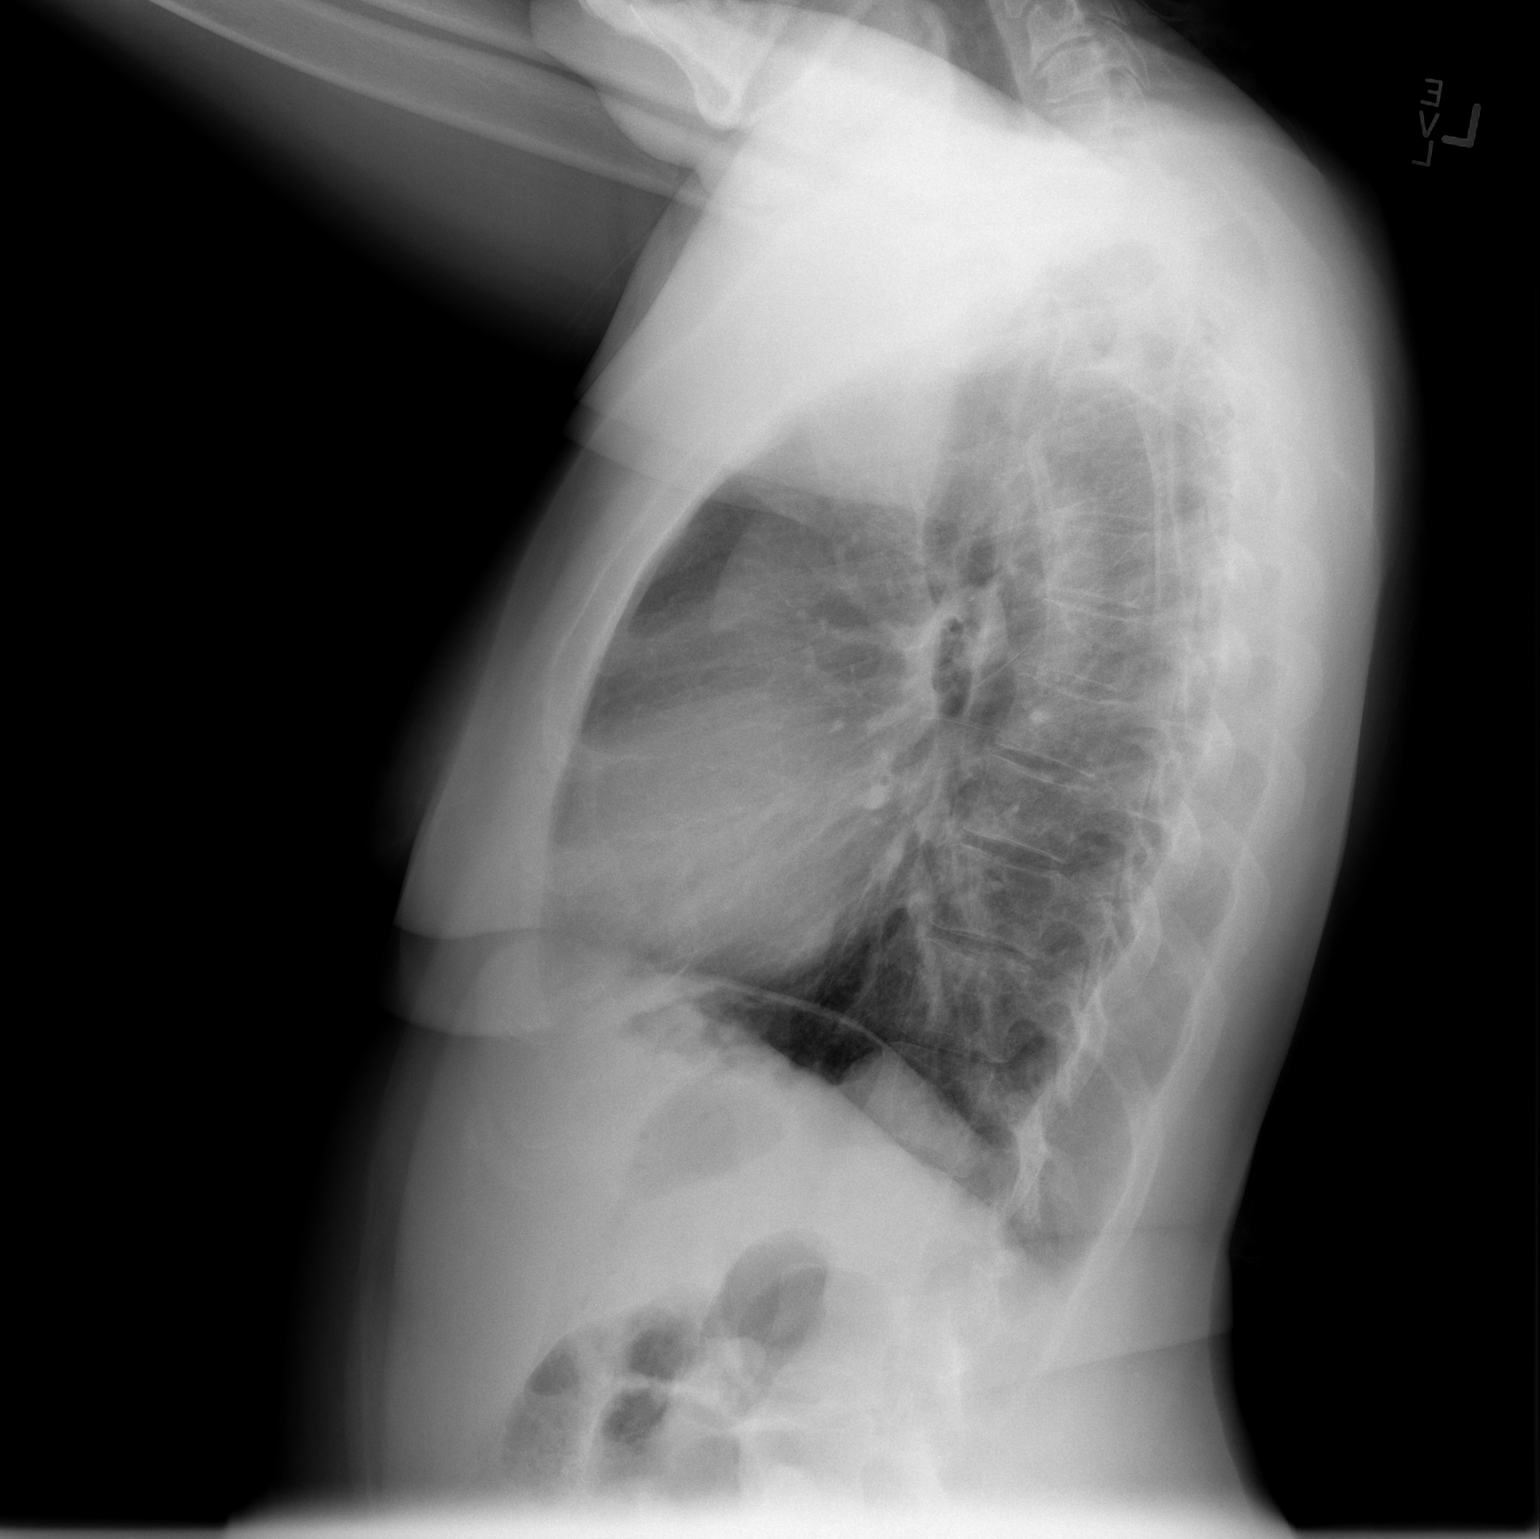

[2 of 2 positions shown; findings below may reference images not displayed]

FINDINGS: The heart size and mediastinal contours are within normal limits.
Both lungs are clear. The visualized skeletal structures are
unremarkable.
IMPRESSION: No active cardiopulmonary disease.

## 2019-08-23 ENCOUNTER — Ambulatory Visit: Payer: 59 | Admitting: Family Medicine

## 2021-06-18 ENCOUNTER — Encounter: Payer: Self-pay | Admitting: Family Medicine

## 2021-06-18 ENCOUNTER — Ambulatory Visit (INDEPENDENT_AMBULATORY_CARE_PROVIDER_SITE_OTHER): Payer: No Typology Code available for payment source | Admitting: Family Medicine

## 2021-06-18 VITALS — BP 108/72 | Ht 60.0 in | Wt 145.0 lb

## 2021-06-18 DIAGNOSIS — M654 Radial styloid tenosynovitis [de Quervain]: Secondary | ICD-10-CM | POA: Diagnosis not present

## 2021-06-18 MED ORDER — DICLOFENAC SODIUM 75 MG PO TBEC: 75.0000 mg | DELAYED_RELEASE_TABLET | Freq: Two times a day (BID) | ORAL | 1 refills | Status: AC

## 2021-06-18 NOTE — Patient Instructions (Addendum)
You have deQuervain's tenosynovitis of your thumb/wrist. Avoid painful activities as much as possible. Wear the thumb spica brace as often as possible to rest this. Ice 15 minutes at a time 3-4 times a day. Take diclofenac 75mg  twice a day with food for pain and inflammation - take for at least 7 days then as needed.  Don't take aleve or ibuprofen while on this medicine. A cortisone injection typically helps a great deal with this and is an option if not improving over the next 2-4 weeks. Follow up with me in 2-4 weeks for reevaluation.

## 2021-06-18 NOTE — Progress Notes (Signed)
PCP: Patient, No Pcp Per (Inactive)  Subjective:   HPI: Patient is a 61 y.o. female here for right radial sided wrist pain for 2 weeks.  She works at [employer] and [employer].  No known falls, injury or trauma.  Has history of bilateral carpal tunnel syndrome and surgery.  Also has history of trigger finger.  She denies paresthesias but endorses radiation of the pain into the elbow.  Things like attempting to wipe when going to the restroom and turning aggravates it.  Not moving the wrist makes it better.  No past medical history on file.  No current outpatient medications on file prior to visit.   No current facility-administered medications on file prior to visit.    Past Surgical History:  Procedure Laterality Date   ABDOMINAL HYSTERECTOMY     CARPAL TUNNEL RELEASE     CESAREAN SECTION      No Known Allergies  BP 108/72    Ht 5' (1.524 m)    Wt 145 lb (65.8 kg)    BMI 28.32 kg/m   [facility] 06/18/2021  Frequency of aerobic exercise (# of days/week) 4  Average time in minutes 25  Frequency of strengthening activities (# of days/week) 0    No flowsheet data found.      Objective:  Physical Exam:  Gen: NAD, comfortable in exam room  Right hand: Inspection: No obvious deformity. No erythema or bruising.  Swelling noticeable over distal lateral forearm. Palpation: TTP 1st dorsal compartment. ROM: Full ROM of the digits and wrist. Fully able to extend and flex finger. Strength: 5/5 strength in the forearm, wrist and interosseus muscles Neurovascular: NV intact  Special tests: Positive finkelstein's, negative tinel's at the carpal tunnel, negative snuffbox test  Fingers:  No swelling in PIP, DIP joints   Assessment & Plan:  1.  De Quervain's tenosynovitis 2 weeks of pain over 1st dorsal compartment without known injury or fall on outstretched hand.  Obvious swelling here.  Positive Finkelstein, negative snuffbox tenderness, negative  Tinel's.  Thumb spica brace to minimize irritation.  Start oral diclofenac twice daily for a week.  Encouraged icing.  Can consider cortisone injection if no improvement at follow-up.  Follow-up in 2 to 4 weeks for reevaluation.

## 2021-06-22 ENCOUNTER — Telehealth: Payer: Self-pay | Admitting: Sports Medicine

## 2021-06-22 NOTE — Telephone Encounter (Signed)
Patient called states may need work note amended, employer can't accommodate the 1 lb work restriction . ? ?--Pt has question of when it recommended she can do 10 lb weight ? ?Forward message to med asst to call pt back for discussion ?

## 2021-07-09 ENCOUNTER — Ambulatory Visit (INDEPENDENT_AMBULATORY_CARE_PROVIDER_SITE_OTHER): Payer: No Typology Code available for payment source | Admitting: Family Medicine

## 2021-07-09 VITALS — BP 122/78 | Ht 60.0 in | Wt 146.0 lb

## 2021-07-09 DIAGNOSIS — M654 Radial styloid tenosynovitis [de Quervain]: Secondary | ICD-10-CM

## 2021-07-09 NOTE — Patient Instructions (Signed)
It was your left wrist that we injected a few years ago - you haven't had any on the right. ?You have deQuervain's tenosynovitis of your thumb/wrist. ?Avoid painful activities as much as possible. ?Wear the thumb spica brace as often as possible to rest this. ?Ice 15 minutes at a time 3-4 times a day. ?Take diclofenac 75mg  twice a day with food for pain and inflammation as needed.  Don't take aleve or ibuprofen while on this medicine. ?Start occupational therapy with iontophoresis. ?Consider steroid injection, oral prednisone pack if not improving. ?Follow up with me in 1 month for reevaluation. ?

## 2021-07-10 ENCOUNTER — Ambulatory Visit: Payer: BC Managed Care – PPO | Admitting: Occupational Therapy

## 2021-07-10 NOTE — Progress Notes (Signed)
PCP: Patient, No Pcp Per (Inactive) ? ?Subjective:  ? ?HPI: ?Patient is a 61 y.o. female here for right wrist pain. ? ?2/27: ?Patient is a 61 y.o. female here for right radial sided wrist pain for 2 weeks.  She works at [employer] and [employer].  No known falls, injury or trauma.  Has history of bilateral carpal tunnel syndrome and surgery.  Also has history of trigger finger.  She denies paresthesias but endorses radiation of the pain into the elbow.  Things like attempting to wipe when going to the restroom and turning aggravates it.  Not moving the wrist makes it better. ? ?3/20: ?Unfortunately patient continues to struggle with radial sided right wrist pain. ?She has been using thumb spica, taking diclofenac. ?Pain is about the same however. ?On restrictions at work. ?No numbness or tingling. ? ?No past medical history on file. ? ?Current Outpatient Medications on File Prior to Visit  ?Medication Sig Dispense Refill  ? diclofenac (VOLTAREN) 75 MG EC tablet Take 1 tablet (75 mg total) by mouth 2 (two) times daily. 60 tablet 1  ? ?No current facility-administered medications on file prior to visit.  ? ? ?Past Surgical History:  ?Procedure Laterality Date  ? ABDOMINAL HYSTERECTOMY    ? CARPAL TUNNEL RELEASE    ? CESAREAN SECTION    ? ? ?No Known Allergies ? ?BP 122/78   Ht 5' (1.524 m)   Wt 146 lb (66.2 kg)   BMI 28.51 kg/m?  ? ?[facility] Adult Exercise 06/18/2021  ?Frequency of aerobic exercise (# of days/week) 4  ?Average time in minutes 25  ?Frequency of strengthening activities (# of days/week) 0  ? ? ?No flowsheet data found. ? ?    ?Objective:  ?Physical Exam: ? ?Gen: NAD, comfortable in exam room ? ?Right wrist/hand: ?No deformity. ?FROM with 5/5 strength. ?Tenderness to palpation 1st dorsal compartment.  No tenderness 1st CMC or carpal tunnel. ?NVI distally. ?Positive finkelsteins, negative tinels carpal tunnel. ?  ?Assessment & Plan:  ?1. Right de Quervain's tenosynovitis - start  occupational therapy with iontophoresis.  Icing, thumb spica brace, diclofenac.  F/u in 1 month for reevaluation.  Continue current restrictions.  Consider steroid injection, oral prednisone pack if not improving. ?

## 2021-08-06 ENCOUNTER — Ambulatory Visit: Payer: BC Managed Care – PPO | Admitting: Family Medicine
